# Patient Record
Sex: Female | Born: 2005 | Race: White | Hispanic: No | Smoking: Never smoker
Health system: Southern US, Community
[De-identification: ages and names within clinical notes are randomized; demographics above are authoritative.]

---
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
## 2023-04-07 ENCOUNTER — Ambulatory Visit (INDEPENDENT_AMBULATORY_CARE_PROVIDER_SITE_OTHER): Payer: 59 | Admitting: Family Medicine

## 2023-04-07 ENCOUNTER — Encounter: Payer: Self-pay | Admitting: Family Medicine

## 2023-04-07 VITALS — BP 98/72 | HR 78 | Temp 98.7°F | Ht 68.0 in | Wt 119.1 lb

## 2023-04-07 DIAGNOSIS — Z111 Encounter for screening for respiratory tuberculosis: Secondary | ICD-10-CM | POA: Diagnosis not present

## 2023-04-07 DIAGNOSIS — Z0000 Encounter for general adult medical examination without abnormal findings: Secondary | ICD-10-CM | POA: Insufficient documentation

## 2023-04-07 NOTE — Patient Instructions (Signed)
Follow up in 1 year or as needed We'll notify you of your lab result and make any changes if needed You look great!  Keep up the good work! Call with any questions or concerns Stay Safe!  Stay Healthy! GOOD LUCK WITH SCHOOL!!!

## 2023-04-07 NOTE — Assessment & Plan Note (Signed)
Pt's PE WNL.  UTD on immunizations.  No need for labs due to lack of risk factors.  TB screen for school.  Anticipatory guidance provided.

## 2023-04-07 NOTE — Progress Notes (Signed)
   Subjective:    Patient ID: Jacqueline Thomas, female    DOB: [birth year], 18 y.o.   MRN: [id number]  HPI New to establish.  Previous PCP- [facility]  No medical issues, no concerns.  Applying to nursing school.  Is hoping to go to [employer]  Has Nexplanon and will use OCPs as needed for heavy bleeding. No concerns for sexually transmitted infxns.     Review of Systems Patient reports no vision/ hearing changes, adenopathy,fever, weight change,  persistant/recurrent hoarseness , swallowing issues, chest pain, palpitations, edema, persistant/recurrent cough, hemoptysis, dyspnea (rest/exertional/paroxysmal nocturnal), gastrointestinal bleeding (melena, rectal bleeding), abdominal pain, significant heartburn, bowel changes, GU symptoms (dysuria, hematuria, incontinence), Gyn symptoms (abnormal  bleeding, pain),  syncope, focal weakness, memory loss, numbness & tingling, skin/hair/nail changes, abnormal bruising or bleeding, anxiety, or depression.     Objective:   Physical Exam General Appearance:    Alert, cooperative, no distress, appears stated age  Head:    Normocephalic, without obvious abnormality, atraumatic  Eyes:    PERRL, conjunctiva/corneas clear, EOM's intact both eyes  Ears:    Normal TM's and external ear canals, both ears  Nose:   Nares normal, septum midline, mucosa normal, no drainage    or sinus tenderness  Throat:   Lips, mucosa, and tongue normal; teeth and gums normal  Neck:   Supple, symmetrical, trachea midline, no adenopathy;    Thyroid: no enlargement/tenderness/nodules  Back:     Symmetric, no curvature, ROM normal, no CVA tenderness  Lungs:     Clear to auscultation bilaterally, respirations unlabored  Chest Wall:    No tenderness or deformity   Heart:    Regular rate and rhythm, S1 and S2 normal, no murmur, rub   or gallop  Breast Exam:    Deferred to GYN  Abdomen:     Soft, non-tender, bowel sounds active all four quadrants,    no masses, no organomegaly   Genitalia:    Deferred to GYN  Rectal:    Extremities:   Extremities normal, atraumatic, no cyanosis or edema  Pulses:   2+ and symmetric all extremities  Skin:   Skin color, texture, turgor normal, no rashes or lesions  Lymph nodes:   Cervical, supraclavicular, and axillary nodes normal  Neurologic:   CNII-XII intact, normal strength, sensation and reflexes    throughout          Assessment & Plan:

## 2023-04-09 LAB — QUANTIFERON-TB GOLD PLUS
Mitogen-NIL: 8.38 [IU]/mL
NIL: 0.13 [IU]/mL
QuantiFERON-TB Gold Plus: NEGATIVE
TB1-NIL: 0.1 [IU]/mL
TB2-NIL: 0.16 [IU]/mL

## 2023-04-10 ENCOUNTER — Encounter: Payer: Self-pay | Admitting: Family Medicine

## 2023-04-10 ENCOUNTER — Telehealth: Payer: Self-pay

## 2023-04-10 NOTE — Telephone Encounter (Signed)
-----   Message from Neena Rhymes sent at 04/10/2023  7:50 AM EST ----- Normal TB screen- great news!

## 2023-04-14 NOTE — Telephone Encounter (Signed)
Left Voicemail for patient to return call for lab results

## 2023-04-15 NOTE — Telephone Encounter (Signed)
Patient has reviewed via MyChart called to confirm she did understand those results and ask if she has any questions at this time

## 2023-09-07 ENCOUNTER — Other Ambulatory Visit: Payer: Self-pay | Admitting: Obstetrics and Gynecology

## 2023-09-07 DIAGNOSIS — N63 Unspecified lump in unspecified breast: Secondary | ICD-10-CM

## 2024-03-08 ENCOUNTER — Ambulatory Visit: Admitting: Family Medicine

## 2024-04-07 ENCOUNTER — Encounter: Payer: 59 | Admitting: Family Medicine
